# Patient Record
Sex: Female | Born: 1975 | Race: White | Hispanic: No | Marital: Married | Smoking: Former smoker
Health system: Southern US, Community
[De-identification: ages and names within clinical notes are randomized; demographics above are authoritative.]

## PROBLEM LIST (undated history)

## (undated) DIAGNOSIS — F329 Major depressive disorder, single episode, unspecified: Secondary | ICD-10-CM

## (undated) DIAGNOSIS — F32A Depression, unspecified: Secondary | ICD-10-CM

## (undated) HISTORY — PX: TUBAL LIGATION: SHX77

---

## 2014-05-22 ENCOUNTER — Emergency Department: Payer: Self-pay | Admitting: Internal Medicine

## 2015-11-27 ENCOUNTER — Emergency Department: Payer: BLUE CROSS/BLUE SHIELD

## 2015-11-27 ENCOUNTER — Encounter: Payer: Self-pay | Admitting: Emergency Medicine

## 2015-11-27 ENCOUNTER — Emergency Department
Admission: EM | Admit: 2015-11-27 | Discharge: 2015-11-27 | Disposition: A | Discharge status: Home / Self Care | Payer: BLUE CROSS/BLUE SHIELD | Attending: Student | Admitting: Student

## 2015-11-27 DIAGNOSIS — Y999 Unspecified external cause status: Secondary | ICD-10-CM | POA: Insufficient documentation

## 2015-11-27 DIAGNOSIS — S0093XA Contusion of unspecified part of head, initial encounter: Secondary | ICD-10-CM | POA: Diagnosis not present

## 2015-11-27 DIAGNOSIS — R51 Headache: Secondary | ICD-10-CM | POA: Diagnosis present

## 2015-11-27 DIAGNOSIS — F329 Major depressive disorder, single episode, unspecified: Secondary | ICD-10-CM | POA: Diagnosis not present

## 2015-11-27 DIAGNOSIS — Y9389 Activity, other specified: Secondary | ICD-10-CM | POA: Diagnosis not present

## 2015-11-27 DIAGNOSIS — S8001XA Contusion of right knee, initial encounter: Secondary | ICD-10-CM | POA: Insufficient documentation

## 2015-11-27 DIAGNOSIS — Y92410 Unspecified street and highway as the place of occurrence of the external cause: Secondary | ICD-10-CM | POA: Insufficient documentation

## 2015-11-27 DIAGNOSIS — S5002XA Contusion of left elbow, initial encounter: Secondary | ICD-10-CM | POA: Insufficient documentation

## 2015-11-27 DIAGNOSIS — S161XXA Strain of muscle, fascia and tendon at neck level, initial encounter: Secondary | ICD-10-CM | POA: Diagnosis not present

## 2015-11-27 DIAGNOSIS — Z87891 Personal history of nicotine dependence: Secondary | ICD-10-CM | POA: Diagnosis not present

## 2015-11-27 HISTORY — DX: Major depressive disorder, single episode, unspecified: F32.9

## 2015-11-27 HISTORY — DX: Depression, unspecified: F32.A

## 2015-11-27 MED ORDER — CYCLOBENZAPRINE HCL 10 MG PO TABS: 10.0000 mg | ORAL_TABLET | Freq: Three times a day (TID) | ORAL | Status: AC | PRN

## 2015-11-27 MED ORDER — IBUPROFEN 800 MG PO TABS: 800.0000 mg | ORAL_TABLET | Freq: Three times a day (TID) | ORAL | Status: AC | PRN

## 2015-11-27 MED ORDER — HYDROCODONE-ACETAMINOPHEN 5-325 MG PO TABS: 1.0000 | ORAL_TABLET | ORAL | Status: AC | PRN
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
